# Patient Record
Sex: Female | Born: 1950 | Race: White | Hispanic: No | Marital: Married | Smoking: Never smoker
Health system: Southern US, Community
[De-identification: ages and names within clinical notes are randomized; demographics above are authoritative.]

## PROBLEM LIST (undated history)

## (undated) DIAGNOSIS — G43909 Migraine, unspecified, not intractable, without status migrainosus: Secondary | ICD-10-CM

## (undated) DIAGNOSIS — M859 Disorder of bone density and structure, unspecified: Secondary | ICD-10-CM

## (undated) DIAGNOSIS — F32A Depression, unspecified: Secondary | ICD-10-CM

## (undated) DIAGNOSIS — E559 Vitamin D deficiency, unspecified: Secondary | ICD-10-CM

## (undated) HISTORY — PX: ANTERIOR CERVICAL DECOMPRESSION/DISCECTOMY FUSION 4 LEVEL/HARDWARE REMOVAL: SHX5551

---

## 1999-04-09 ENCOUNTER — Ambulatory Visit (HOSPITAL_COMMUNITY): Admission: RE | Admit: 1999-04-09 | Discharge: 1999-04-09 | Payer: Self-pay | Admitting: *Deleted

## 1999-04-09 ENCOUNTER — Encounter (INDEPENDENT_AMBULATORY_CARE_PROVIDER_SITE_OTHER): Payer: Self-pay | Admitting: Specialist

## 2001-02-17 ENCOUNTER — Other Ambulatory Visit: Admission: RE | Admit: 2001-02-17 | Discharge: 2001-02-17 | Payer: Self-pay | Admitting: *Deleted

## 2002-02-24 ENCOUNTER — Other Ambulatory Visit: Admission: RE | Admit: 2002-02-24 | Discharge: 2002-02-24 | Payer: Self-pay | Admitting: Obstetrics and Gynecology

## 2002-03-14 ENCOUNTER — Encounter: Payer: Self-pay | Admitting: Obstetrics and Gynecology

## 2002-03-14 ENCOUNTER — Encounter: Admission: RE | Admit: 2002-03-14 | Discharge: 2002-03-14 | Payer: Self-pay | Admitting: Obstetrics and Gynecology

## 2003-03-14 ENCOUNTER — Other Ambulatory Visit: Admission: RE | Admit: 2003-03-14 | Discharge: 2003-03-14 | Payer: Self-pay | Admitting: Obstetrics and Gynecology

## 2004-04-02 ENCOUNTER — Encounter: Admission: RE | Admit: 2004-04-02 | Discharge: 2004-04-02 | Payer: Self-pay | Admitting: Obstetrics and Gynecology

## 2004-04-04 ENCOUNTER — Encounter (INDEPENDENT_AMBULATORY_CARE_PROVIDER_SITE_OTHER): Payer: Self-pay | Admitting: Specialist

## 2004-04-04 ENCOUNTER — Ambulatory Visit (HOSPITAL_COMMUNITY): Admission: RE | Admit: 2004-04-04 | Discharge: 2004-04-04 | Payer: Self-pay | Admitting: *Deleted

## 2008-03-08 ENCOUNTER — Encounter: Admission: RE | Admit: 2008-03-08 | Discharge: 2008-03-08 | Payer: Self-pay | Admitting: Obstetrics and Gynecology

## 2009-03-19 ENCOUNTER — Encounter: Admission: RE | Admit: 2009-03-19 | Discharge: 2009-03-19 | Payer: Self-pay | Admitting: Obstetrics and Gynecology

## 2009-09-03 ENCOUNTER — Encounter: Admission: RE | Admit: 2009-09-03 | Discharge: 2009-09-03 | Payer: Self-pay | Admitting: Family Medicine

## 2010-03-25 ENCOUNTER — Encounter: Admission: RE | Admit: 2010-03-25 | Discharge: 2010-03-25 | Payer: Self-pay | Admitting: Obstetrics and Gynecology

## 2011-02-12 ENCOUNTER — Other Ambulatory Visit: Payer: Self-pay | Admitting: Obstetrics and Gynecology

## 2011-02-12 DIAGNOSIS — Z1231 Encounter for screening mammogram for malignant neoplasm of breast: Secondary | ICD-10-CM

## 2011-04-21 ENCOUNTER — Ambulatory Visit
Admission: RE | Admit: 2011-04-21 | Discharge: 2011-04-21 | Disposition: A | Discharge status: Home / Self Care | Payer: BC Managed Care – PPO | Source: Ambulatory Visit | Attending: Obstetrics and Gynecology | Admitting: Obstetrics and Gynecology

## 2011-04-21 DIAGNOSIS — Z1231 Encounter for screening mammogram for malignant neoplasm of breast: Secondary | ICD-10-CM

## 2012-04-12 ENCOUNTER — Other Ambulatory Visit: Payer: Self-pay | Admitting: Obstetrics and Gynecology

## 2012-04-12 DIAGNOSIS — Z1231 Encounter for screening mammogram for malignant neoplasm of breast: Secondary | ICD-10-CM

## 2012-04-28 ENCOUNTER — Ambulatory Visit
Admission: RE | Admit: 2012-04-28 | Discharge: 2012-04-28 | Disposition: A | Discharge status: Home / Self Care | Payer: BC Managed Care – PPO | Source: Ambulatory Visit | Attending: Obstetrics and Gynecology | Admitting: Obstetrics and Gynecology

## 2012-04-28 DIAGNOSIS — Z1231 Encounter for screening mammogram for malignant neoplasm of breast: Secondary | ICD-10-CM

## 2013-07-01 ENCOUNTER — Other Ambulatory Visit: Payer: Self-pay

## 2013-07-01 DIAGNOSIS — Z1231 Encounter for screening mammogram for malignant neoplasm of breast: Secondary | ICD-10-CM

## 2013-08-23 ENCOUNTER — Ambulatory Visit
Admission: RE | Admit: 2013-08-23 | Discharge: 2013-08-23 | Disposition: A | Discharge status: Home / Self Care | Payer: BC Managed Care – PPO | Source: Ambulatory Visit

## 2013-08-23 DIAGNOSIS — Z1231 Encounter for screening mammogram for malignant neoplasm of breast: Secondary | ICD-10-CM

## 2014-01-05 ENCOUNTER — Other Ambulatory Visit: Payer: Self-pay | Admitting: Obstetrics and Gynecology

## 2014-01-05 DIAGNOSIS — Z780 Asymptomatic menopausal state: Secondary | ICD-10-CM

## 2014-01-05 DIAGNOSIS — E2839 Other primary ovarian failure: Secondary | ICD-10-CM

## 2014-04-18 ENCOUNTER — Ambulatory Visit
Admission: RE | Admit: 2014-04-18 | Discharge: 2014-04-18 | Disposition: A | Discharge status: Home / Self Care | Payer: BC Managed Care – PPO | Source: Ambulatory Visit | Attending: Obstetrics and Gynecology | Admitting: Obstetrics and Gynecology

## 2014-04-18 DIAGNOSIS — Z780 Asymptomatic menopausal state: Secondary | ICD-10-CM

## 2014-04-18 DIAGNOSIS — E2839 Other primary ovarian failure: Secondary | ICD-10-CM

## 2017-07-03 LAB — HM MAMMOGRAPHY

## 2017-07-09 ENCOUNTER — Other Ambulatory Visit: Payer: Self-pay | Admitting: Obstetrics and Gynecology

## 2017-07-09 DIAGNOSIS — M8580 Other specified disorders of bone density and structure, unspecified site: Secondary | ICD-10-CM

## 2017-07-20 ENCOUNTER — Ambulatory Visit
Admission: RE | Admit: 2017-07-20 | Discharge: 2017-07-20 | Disposition: A | Discharge status: Home / Self Care | Payer: BC Managed Care – PPO | Source: Ambulatory Visit | Attending: Family Medicine | Admitting: Family Medicine

## 2017-07-20 ENCOUNTER — Other Ambulatory Visit: Payer: Self-pay | Admitting: Family Medicine

## 2017-07-20 DIAGNOSIS — J189 Pneumonia, unspecified organism: Secondary | ICD-10-CM

## 2017-07-21 ENCOUNTER — Other Ambulatory Visit: Payer: Self-pay

## 2017-08-11 ENCOUNTER — Other Ambulatory Visit: Payer: BC Managed Care – PPO

## 2017-08-19 ENCOUNTER — Ambulatory Visit
Admission: RE | Admit: 2017-08-19 | Discharge: 2017-08-19 | Disposition: A | Discharge status: Home / Self Care | Payer: BC Managed Care – PPO | Source: Ambulatory Visit | Attending: Obstetrics and Gynecology | Admitting: Obstetrics and Gynecology

## 2017-08-19 DIAGNOSIS — M8580 Other specified disorders of bone density and structure, unspecified site: Secondary | ICD-10-CM

## 2018-09-02 IMAGING — CR DG CHEST 2V
2 series · 2 of 2 positions shown · non-contrast
Comparison: Chest x-ray of July 17, 2016

CLINICAL DATA: Cough, wheezing, shortness of breath. Suspected
pneumonia.

EXAM:
CHEST  2 VIEW

[w chest pa]
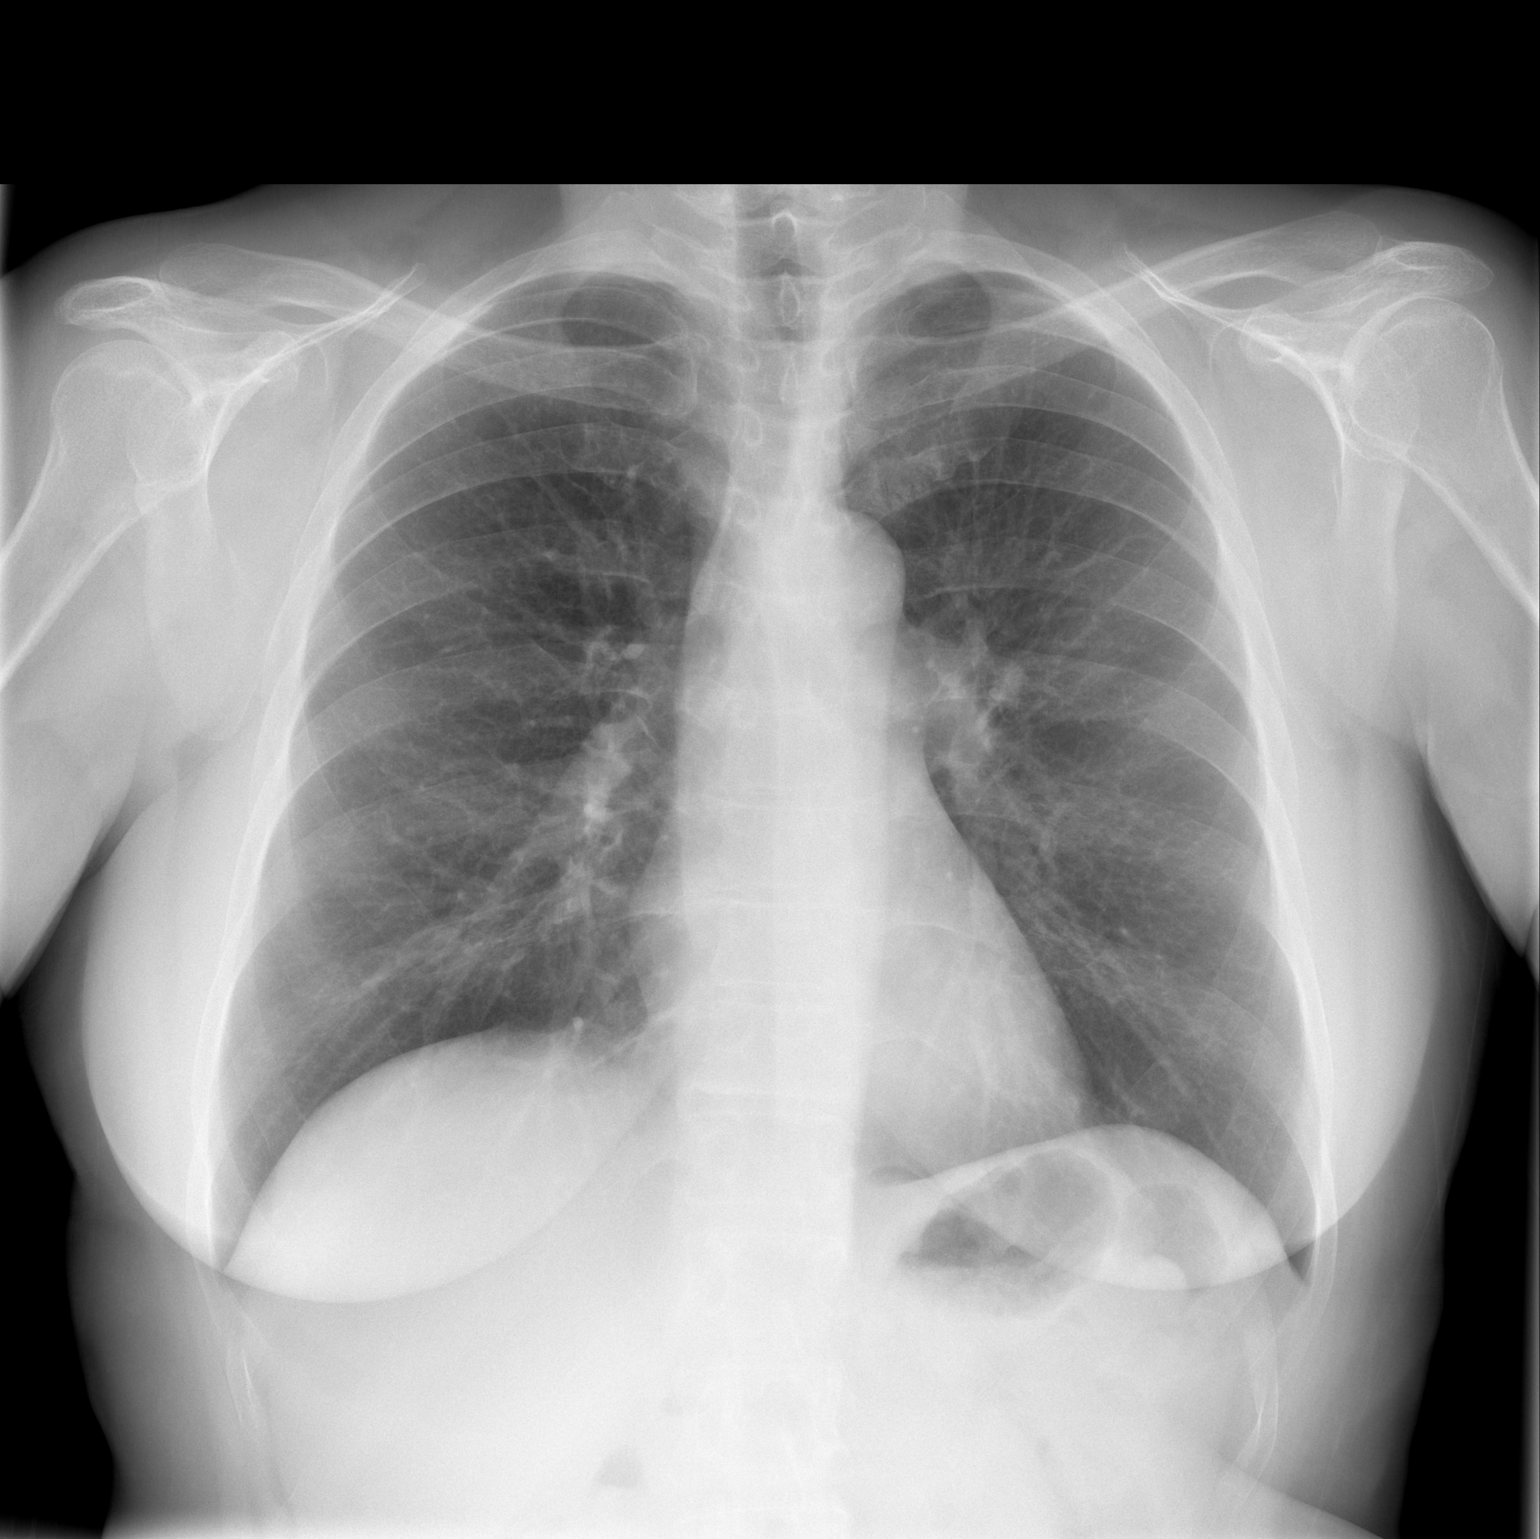

[w chest lat]
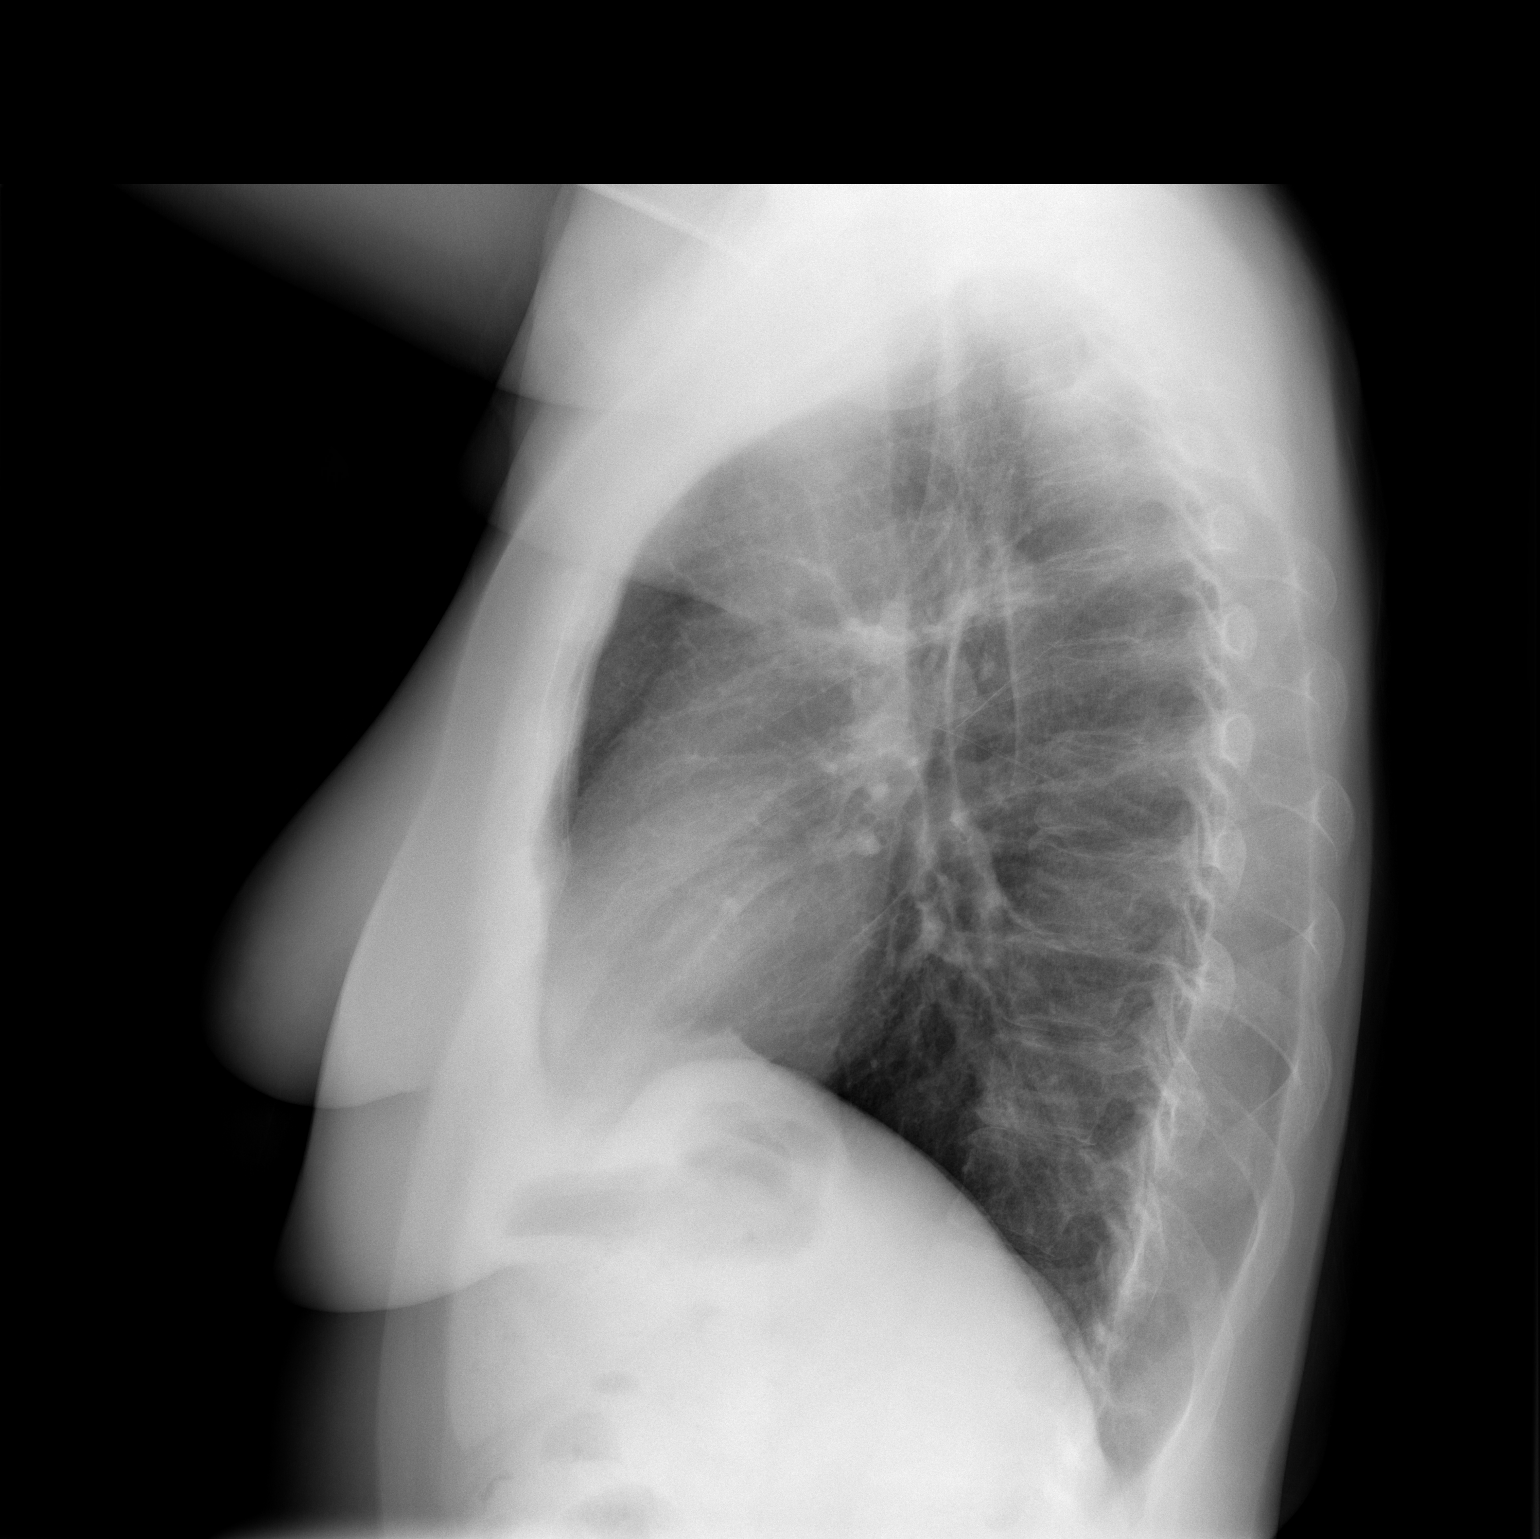

[2 of 2 positions shown; findings below may reference images not displayed]

FINDINGS: The lungs are well-expanded and clear. The heart and pulmonary
vascularity are normal. The mediastinum is normal in width. There is
no pleural effusion. The trachea is midline. The bony thorax is
unremarkable.
IMPRESSION: There is no active cardiopulmonary disease.

## 2019-09-15 ENCOUNTER — Other Ambulatory Visit: Payer: Self-pay | Admitting: Obstetrics and Gynecology

## 2019-09-15 DIAGNOSIS — M8580 Other specified disorders of bone density and structure, unspecified site: Secondary | ICD-10-CM

## 2019-11-11 ENCOUNTER — Ambulatory Visit: Payer: Medicare PPO | Attending: Internal Medicine

## 2019-11-11 DIAGNOSIS — Z23 Encounter for immunization: Secondary | ICD-10-CM

## 2019-11-11 NOTE — Progress Notes (Signed)
   Covid-19 Vaccination Clinic  Name:  Theresa Armstrong    MRN: [id number] DOB: [birth year]  11/11/2019  Theresa Armstrong was observed post Covid-19 immunization for 15 minutes without incident. She was provided with Vaccine Information Sheet and instruction to access the V-Safe system.   Theresa Armstrong was instructed to call 911 with any severe reactions post vaccine: Marland Kitchen Difficulty breathing  . Swelling of face and throat  . A fast heartbeat  . A bad rash all over body  . Dizziness and weakness   Immunizations Administered    Name Date Dose VIS Date Route   Pfizer COVID-19 Vaccine 11/11/2019  9:49 AM 0.3 mL 08/12/2019 Intramuscular   Manufacturer: [employer], [employer]   Lot: [id number]   NDC: [address]-5

## 2019-12-01 ENCOUNTER — Other Ambulatory Visit: Payer: Self-pay

## 2019-12-01 ENCOUNTER — Ambulatory Visit
Admission: RE | Admit: 2019-12-01 | Discharge: 2019-12-01 | Disposition: A | Discharge status: Home / Self Care | Payer: Medicare PPO | Source: Ambulatory Visit | Attending: Obstetrics and Gynecology | Admitting: Obstetrics and Gynecology

## 2019-12-01 DIAGNOSIS — M8580 Other specified disorders of bone density and structure, unspecified site: Secondary | ICD-10-CM

## 2019-12-05 ENCOUNTER — Ambulatory Visit: Payer: Medicare PPO | Attending: Internal Medicine

## 2019-12-05 DIAGNOSIS — Z23 Encounter for immunization: Secondary | ICD-10-CM

## 2019-12-05 NOTE — Progress Notes (Signed)
   Covid-19 Vaccination Clinic  Name:  Theresa Armstrong    MRN: [id number] DOB: 08-05-1951  12/05/2019  Theresa Armstrong was observed post Covid-19 immunization for 15 minutes without incident. She was provided with Vaccine Information Sheet and instruction to access the V-Safe system.   Theresa Armstrong was instructed to call 911 with any severe reactions post vaccine: Marland Kitchen Difficulty breathing  . Swelling of face and throat  . A fast heartbeat  . A bad rash all over body  . Dizziness and weakness   Immunizations Administered    Name Date Dose VIS Date Route   Pfizer COVID-19 Vaccine 12/05/2019  2:01 PM 0.3 mL 08/12/2019 Intramuscular   Manufacturer: [employer], [employer]   Lot: [id number]   NDC: [address]-0

## 2021-11-27 ENCOUNTER — Other Ambulatory Visit: Payer: Self-pay | Admitting: Obstetrics and Gynecology

## 2021-11-27 DIAGNOSIS — E2839 Other primary ovarian failure: Secondary | ICD-10-CM

## 2022-07-14 ENCOUNTER — Ambulatory Visit
Admission: RE | Admit: 2022-07-14 | Discharge: 2022-07-14 | Disposition: A | Discharge status: Home / Self Care | Payer: Medicare PPO | Source: Ambulatory Visit | Attending: Obstetrics and Gynecology | Admitting: Obstetrics and Gynecology

## 2022-07-14 DIAGNOSIS — E2839 Other primary ovarian failure: Secondary | ICD-10-CM

## 2023-12-09 ENCOUNTER — Emergency Department (HOSPITAL_BASED_OUTPATIENT_CLINIC_OR_DEPARTMENT_OTHER): Admitting: Radiology

## 2023-12-09 ENCOUNTER — Emergency Department (HOSPITAL_BASED_OUTPATIENT_CLINIC_OR_DEPARTMENT_OTHER)
Admission: EM | Admit: 2023-12-09 | Discharge: 2023-12-09 | Disposition: A | Discharge status: Home / Self Care | Attending: Emergency Medicine | Admitting: Emergency Medicine

## 2023-12-09 ENCOUNTER — Emergency Department (HOSPITAL_BASED_OUTPATIENT_CLINIC_OR_DEPARTMENT_OTHER)

## 2023-12-09 ENCOUNTER — Other Ambulatory Visit: Payer: Self-pay

## 2023-12-09 ENCOUNTER — Encounter (HOSPITAL_BASED_OUTPATIENT_CLINIC_OR_DEPARTMENT_OTHER): Payer: Self-pay | Admitting: Emergency Medicine

## 2023-12-09 DIAGNOSIS — S62316A Displaced fracture of base of fifth metacarpal bone, right hand, initial encounter for closed fracture: Secondary | ICD-10-CM | POA: Diagnosis not present

## 2023-12-09 DIAGNOSIS — S6991XA Unspecified injury of right wrist, hand and finger(s), initial encounter: Secondary | ICD-10-CM | POA: Diagnosis present

## 2023-12-09 DIAGNOSIS — R079 Chest pain, unspecified: Secondary | ICD-10-CM | POA: Insufficient documentation

## 2023-12-09 DIAGNOSIS — Y92410 Unspecified street and highway as the place of occurrence of the external cause: Secondary | ICD-10-CM | POA: Diagnosis not present

## 2023-12-09 DIAGNOSIS — I700 Atherosclerosis of aorta: Secondary | ICD-10-CM | POA: Diagnosis not present

## 2023-12-09 DIAGNOSIS — S0990XA Unspecified injury of head, initial encounter: Secondary | ICD-10-CM | POA: Diagnosis not present

## 2023-12-09 DIAGNOSIS — S3991XA Unspecified injury of abdomen, initial encounter: Secondary | ICD-10-CM | POA: Insufficient documentation

## 2023-12-09 DIAGNOSIS — R0781 Pleurodynia: Secondary | ICD-10-CM | POA: Diagnosis not present

## 2023-12-09 HISTORY — DX: Vitamin D deficiency, unspecified: E55.9

## 2023-12-09 HISTORY — DX: Migraine, unspecified, not intractable, without status migrainosus: G43.909

## 2023-12-09 HISTORY — DX: Depression, unspecified: F32.A

## 2023-12-09 HISTORY — DX: Disorder of bone density and structure, unspecified: M85.9

## 2023-12-09 LAB — BASIC METABOLIC PANEL WITH GFR
Anion gap: 10 (ref 5–15)
BUN: 25 mg/dL — ABNORMAL HIGH (ref 8–23)
CO2: 25 mmol/L (ref 22–32)
Calcium: 9.6 mg/dL (ref 8.9–10.3)
Chloride: 103 mmol/L (ref 98–111)
Creatinine, Ser: 0.95 mg/dL (ref 0.44–1.00)
GFR, Estimated: >60 mL/min (ref >60)
Glucose, Bld: 109 mg/dL — ABNORMAL HIGH (ref 70–99)
Potassium: 4 mmol/L (ref 3.5–5.1)
Sodium: 138 mmol/L (ref 135–145)

## 2023-12-09 LAB — CBC
HCT: 35.4 % — ABNORMAL LOW (ref 36.0–46.0)
Hemoglobin: 11.8 g/dL — ABNORMAL LOW (ref 12.0–15.0)
MCH: 32.7 pg (ref 26.0–34.0)
MCHC: 33.3 g/dL (ref 30.0–36.0)
MCV: 98.1 fL (ref 80.0–100.0)
Platelets: 284 10*3/uL (ref 150–400)
RBC: 3.61 MIL/uL — ABNORMAL LOW (ref 3.87–5.11)
RDW: 13.2 % (ref 11.5–15.5)
WBC: 11.3 10*3/uL — ABNORMAL HIGH (ref 4.0–10.5)
nRBC: 0 % (ref 0.0–0.2)

## 2023-12-09 LAB — TROPONIN I (HIGH SENSITIVITY)
Troponin I (High Sensitivity): 2 ng/L (ref <18)
Troponin I (High Sensitivity): 4 ng/L (ref <18)

## 2023-12-09 MED ORDER — NAPROXEN 500 MG PO TABS
500.0000 mg | ORAL_TABLET | Freq: Two times a day (BID) | ORAL | 0 refills | Status: DC
  Filled 2023-12-09: qty 14, 7d supply, fill #0

## 2023-12-09 MED ORDER — NAPROXEN 500 MG PO TABS: 500.0000 mg | ORAL_TABLET | Freq: Two times a day (BID) | ORAL | 0 refills | Status: AC

## 2023-12-09 MED ORDER — METHOCARBAMOL 500 MG PO TABS
500.0000 mg | ORAL_TABLET | Freq: Two times a day (BID) | ORAL | 0 refills | Status: DC
  Filled 2023-12-09: qty 14, 7d supply, fill #0

## 2023-12-09 MED ORDER — IOHEXOL 300 MG/ML  SOLN
100.0000 mL | Freq: Once | INTRAMUSCULAR | Status: AC | PRN
  Administered 2023-12-09: 100 mL via INTRAVENOUS

## 2023-12-09 MED ORDER — DIAZEPAM 2 MG PO TABS
2.0000 mg | ORAL_TABLET | Freq: Once | ORAL | Status: AC
  Administered 2023-12-09: 2 mg via ORAL
  Filled 2023-12-09: qty 1

## 2023-12-09 MED ORDER — METHOCARBAMOL 500 MG PO TABS: 500.0000 mg | ORAL_TABLET | Freq: Two times a day (BID) | ORAL | 0 refills | Status: AC

## 2023-12-09 MED ORDER — LIDOCAINE HCL (PF) 1 % IJ SOLN
INTRAMUSCULAR | Status: AC
  Filled 2023-12-09: qty 5
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
## 2023-12-09 NOTE — Discharge Instructions (Addendum)
 You will need to schedule an appointment with your hand surgeon in order to have further management of the fracture of your fifth metacarpal.  You were prescribed a short course of anti-inflammatories along with muscle relaxers to help with pain control.
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
## 2023-12-10 ENCOUNTER — Other Ambulatory Visit (HOSPITAL_BASED_OUTPATIENT_CLINIC_OR_DEPARTMENT_OTHER): Payer: Self-pay

## 2024-05-23 ENCOUNTER — Other Ambulatory Visit: Payer: Self-pay | Admitting: Student

## 2024-05-23 DIAGNOSIS — R1030 Lower abdominal pain, unspecified: Secondary | ICD-10-CM

## 2024-05-23 DIAGNOSIS — R109 Unspecified abdominal pain: Secondary | ICD-10-CM

## 2024-05-24 ENCOUNTER — Encounter: Payer: Self-pay | Admitting: Student

## 2024-05-27 ENCOUNTER — Other Ambulatory Visit: Payer: Self-pay | Admitting: Student

## 2024-05-27 DIAGNOSIS — R109 Unspecified abdominal pain: Secondary | ICD-10-CM

## 2024-06-01 ENCOUNTER — Ambulatory Visit
Admission: RE | Admit: 2024-06-01 | Discharge: 2024-06-01 | Disposition: A | Discharge status: Home / Self Care | Source: Ambulatory Visit | Attending: Student | Admitting: Student

## 2024-06-01 DIAGNOSIS — R109 Unspecified abdominal pain: Secondary | ICD-10-CM

## 2024-06-02 ENCOUNTER — Ambulatory Visit
Admission: RE | Admit: 2024-06-02 | Discharge: 2024-06-02 | Disposition: A | Discharge status: Home / Self Care | Source: Ambulatory Visit | Attending: Student | Admitting: Student

## 2024-06-02 ENCOUNTER — Encounter: Payer: Self-pay | Admitting: Radiology

## 2024-06-02 ENCOUNTER — Other Ambulatory Visit

## 2024-06-02 DIAGNOSIS — R109 Unspecified abdominal pain: Secondary | ICD-10-CM

## 2024-06-02 MED ORDER — IOPAMIDOL (ISOVUE-300) INJECTION 61%
100.0000 mL | Freq: Once | INTRAVENOUS | Status: AC | PRN
  Administered 2024-06-02: 100 mL via INTRAVENOUS
